# Patient Record
Sex: Male | Born: 1980 | Race: White | Hispanic: No | Marital: Married | Smoking: Current every day smoker
Health system: Southern US, Community
[De-identification: ages and names within clinical notes are randomized; demographics above are authoritative.]

---

## 2012-08-14 ENCOUNTER — Emergency Department: Payer: Self-pay | Admitting: Emergency Medicine

## 2014-03-10 ENCOUNTER — Emergency Department: Payer: Self-pay | Admitting: Emergency Medicine

## 2014-12-03 ENCOUNTER — Emergency Department: Payer: Self-pay | Admitting: Emergency Medicine

## 2015-02-07 ENCOUNTER — Emergency Department (HOSPITAL_COMMUNITY)
Admission: EM | Admit: 2015-02-07 | Discharge: 2015-02-07 | Disposition: A | Discharge status: Home / Self Care | Payer: Medicaid Other | Attending: Emergency Medicine | Admitting: Emergency Medicine

## 2015-02-07 ENCOUNTER — Emergency Department (HOSPITAL_COMMUNITY): Payer: Medicaid Other

## 2015-02-07 ENCOUNTER — Encounter (HOSPITAL_COMMUNITY): Payer: Self-pay | Admitting: Adult Health

## 2015-02-07 DIAGNOSIS — Y280XXA Contact with sharp glass, undetermined intent, initial encounter: Secondary | ICD-10-CM | POA: Insufficient documentation

## 2015-02-07 DIAGNOSIS — S61214A Laceration without foreign body of right ring finger without damage to nail, initial encounter: Secondary | ICD-10-CM | POA: Diagnosis not present

## 2015-02-07 DIAGNOSIS — Y9389 Activity, other specified: Secondary | ICD-10-CM | POA: Insufficient documentation

## 2015-02-07 DIAGNOSIS — S6991XA Unspecified injury of right wrist, hand and finger(s), initial encounter: Secondary | ICD-10-CM | POA: Diagnosis present

## 2015-02-07 DIAGNOSIS — S61219A Laceration without foreign body of unspecified finger without damage to nail, initial encounter: Secondary | ICD-10-CM

## 2015-02-07 DIAGNOSIS — Y999 Unspecified external cause status: Secondary | ICD-10-CM | POA: Insufficient documentation

## 2015-02-07 DIAGNOSIS — Z23 Encounter for immunization: Secondary | ICD-10-CM | POA: Diagnosis not present

## 2015-02-07 DIAGNOSIS — Z720 Tobacco use: Secondary | ICD-10-CM | POA: Diagnosis not present

## 2015-02-07 DIAGNOSIS — S61212A Laceration without foreign body of right middle finger without damage to nail, initial encounter: Secondary | ICD-10-CM | POA: Insufficient documentation

## 2015-02-07 DIAGNOSIS — Y929 Unspecified place or not applicable: Secondary | ICD-10-CM | POA: Diagnosis not present

## 2015-02-07 MED ORDER — LIDOCAINE HCL (PF) 1 % IJ SOLN
5.0000 mL | Freq: Once | INTRAMUSCULAR | Status: AC
  Administered 2015-02-07: 5 mL via INTRADERMAL
  Filled 2015-02-07: qty 5

## 2015-02-07 MED ORDER — HYDROCODONE-ACETAMINOPHEN 5-325 MG PO TABS: 1.0000 | ORAL_TABLET | ORAL | Status: AC | PRN

## 2015-02-07 MED ORDER — CEPHALEXIN 500 MG PO CAPS: 500.0000 mg | ORAL_CAPSULE | Freq: Four times a day (QID) | ORAL | Status: AC

## 2015-02-07 MED ORDER — TETANUS-DIPHTH-ACELL PERTUSSIS 5-2.5-18.5 LF-MCG/0.5 IM SUSP
0.5000 mL | Freq: Once | INTRAMUSCULAR | Status: AC
  Administered 2015-02-07: 0.5 mL via INTRAMUSCULAR
  Filled 2015-02-07: qty 0.5

## 2015-02-07 MED ORDER — OXYCODONE-ACETAMINOPHEN 5-325 MG PO TABS
1.0000 | ORAL_TABLET | Freq: Once | ORAL | Status: AC
  Administered 2015-02-07: 1 via ORAL
  Filled 2015-02-07: qty 1

## 2015-02-07 NOTE — ED Notes (Signed)
Pt requested crackers and pt was given graham crackers and coke. OK per RN [place]

## 2015-02-07 NOTE — ED Notes (Addendum)
Some skin noted as missing from right ring finger appears to be scraped away. Large laceration to middle finger noted with some deformity of surface skin noted.  Sensation intact, pt able to move finger against RN's finger.

## 2015-02-07 NOTE — ED Notes (Signed)
PRESENTS WITH RIGHT MIDDLE AND RING FINGER LACERATION FROM A FISH TANK THAT BROKE THIS AFTERNOON. BLEEDING CONTROLLED. TETANUS SHOT ABOUT 6 YEARS AGO.

## 2015-02-07 NOTE — Discharge Instructions (Signed)
Please follow directions provided. Be sure to return to the emergency department in 2 days for a wound recheck. Keep your wound clean and dry and change the dressing daily. You may take ibuprofen for pain. You may take Vicodin for pain not relieved by the ibuprofen. Please take the antibiotic as directed until it is all gone. Don't hesitate to return for any new, worsening, or concerning symptoms.   SEEK IMMEDIATE MEDICAL CARE IF:  Your wound becomes red, swollen, hot, or tender.  You have increasing pain in the wound.  You have a red streak that extends from the wound.  There is pus coming from the wound.  You have a fever.  You have shaking chills.  There is a bad smell coming from the wound.  You have persistent bleeding from the wound.

## 2015-02-07 NOTE — ED Provider Notes (Signed)
CSN: [id number]     Arrival date & time 02/07/15  0158 History   First MD Initiated Contact with Patient 02/07/15 0226     Chief Complaint  Patient presents with  . Laceration   (Consider location/radiation/quality/duration/timing/severity/associated sxs/prior Treatment) HPI  Jordan Clarke is a 34 year old male presenting with laceration to his right hand. He states he was helping a friend move a fish tank and at one point the fish tank pressed up against a wall causing the glass to break and causing him to cut the dorsal aspect of the third and fourth fingers on his right hand. He rates his pain currently 10 out of 10. He denies any numbness, tingling or weakness in his right hand.  History reviewed. No pertinent past medical history. History reviewed. No pertinent past surgical history. History reviewed. No pertinent family history. History  Substance Use Topics  . Smoking status: Current Every Day Smoker    Types: Cigarettes  . Smokeless tobacco: Not on file  . Alcohol Use: No    Review of Systems  Musculoskeletal: Positive for myalgias. Negative for joint swelling.  Skin: Positive for wound.  Neurological: Negative for weakness and numbness.      Allergies  Review of patient's allergies indicates no known allergies.  Home Medications   Prior to Admission medications   Not on File   BP 120/86 mmHg  Pulse 85  Temp(Src) 98 F (36.7 C) (Oral)  Resp 16  SpO2 97% Physical Exam  Constitutional: He is oriented to person, place, and time. He appears well-developed and well-nourished. No distress.  HENT:  Head: Normocephalic and atraumatic.  Eyes: Conjunctivae are normal. Right eye exhibits no discharge. Left eye exhibits no discharge. No scleral icterus.  Cardiovascular: Intact distal pulses.   Pulmonary/Chest: Effort normal.  Musculoskeletal: He exhibits tenderness.       Right hand: He exhibits tenderness and laceration. Normal sensation noted. Normal strength  noted.       Hands: 3.5 cm laceration to dorsal aspect of 3rd finger, superficial laceration to 4th MCP and superficial, abrasions and skin tears noted to 3rd and 4th fingers  Neurological: He is alert and oriented to person, place, and time. Coordination normal.  Skin: He is not diaphoretic.  Nursing note and vitals reviewed.   ED Course  Procedures (including critical care time) NERVE BLOCK Performed by: Harle Battiestysinger, Arwin Bisceglia Consent: Verbal consent obtained. Required items: required blood products, implants, devices, and special equipment available Time out: Immediately prior to procedure a "time out" was called to verify the correct patient, procedure, equipment, support staff and site/side marked as required.  Indication: laceration Nerve block body site: 3rd right finger  Preparation: Patient was prepped and draped in the usual sterile fashion. Needle gauge: 27 G Location technique: anatomical landmarks  Local anesthetic: lidocaine without epi  Anesthetic total: 5 ml  Outcome: pain improved Patient tolerance: Patient tolerated the procedure well with no immediate complications.  LACERATION REPAIR Performed by: Harle Battiestysinger, Lanae Federer Authorized by: Harle Battiestysinger, Aarilyn Dye Consent: Verbal consent obtained. Risks and benefits: risks, benefits and alternatives were discussed Consent given by: patient Patient identity confirmed: provided demographic data Prepped and Draped in normal sterile fashion Wound explored  Laceration Location: 3rd right finger  Laceration Length: 3.5 cm  No Foreign Bodies seen or palpated  Anesthesia: digital block  Local anesthetic: lidocaine 1% without epinephrine  Anesthetic total: 5 ml  Irrigation method: syringe Amount of cleaning: standard  Skin closure: 5-0 prolene  Number of sutures: 2  Technique:  simple interrupted  Patient tolerance: Patient tolerated the procedure well with no immediate complications.   Labs Review Labs  Reviewed - No data to display  Imaging Review Dg Hand Complete Right  02/07/2015   CLINICAL DATA:  Finger laceration from broken fish tank  EXAM: RIGHT HAND - COMPLETE 3+ VIEW  COMPARISON:  None.  FINDINGS: There is no evidence of fracture or dislocation. There is no evidence of arthropathy or other focal bone abnormality. Soft tissues are unremarkable.  IMPRESSION: Negative.   Electronically Signed   By: Ellery Plunkaniel R Mitchell M.D.   On: 02/07/2015 04:31     EKG Interpretation None      MDM   Final diagnoses:  Laceration of finger with delay in treatment, initial encounter   34 yo with laceration to right hand occurring 10-12 hours prior to repair. Copious pressure irrigation performed. X-ray is negative for foreign body. Discussed case with Dr. Marian Sorrowlga.  Tdap booster given. Loose suture repaired of largest wound and cleaning and wound dressed to all other wounds. Pt will return in 2 days for wound recheck. Prescription for keflex provided. Pt is well-appearing, in no acute distress and vital signs reviewed and not concerning. He appears safe to be discharged.  Discharge include follow-up in 2 days in the ED.  Return precautions provided. He is aware of plan and in agreement.   Filed Vitals:   02/07/15 0209 02/07/15 0543  BP: 120/86 124/69  Pulse: 85 76  Temp: 98 F (36.7 C)   TempSrc: Oral   Resp: 16 18  SpO2: 97% 99%    Meds given in ED:  Medications  oxyCODONE-acetaminophen (PERCOCET/ROXICET) 5-325 MG per tablet 1 tablet (1 tablet Oral Given 02/07/15 0343)  lidocaine (PF) (XYLOCAINE) 1 % injection 5 mL (5 mLs Intradermal Given 02/07/15 0343)  Tdap (BOOSTRIX) injection 0.5 mL (0.5 mLs Intramuscular Given 02/07/15 0347)    Discharge Medication List as of 02/07/2015  5:42 AM    START taking these medications   Details  cephALEXin (KEFLEX) 500 MG capsule Take 1 capsule (500 mg total) by mouth 4 (four) times daily., Starting 02/07/2015, Until Discontinued, Print    HYDROcodone-acetaminophen  (NORCO/VICODIN) 5-325 MG per tablet Take 1 tablet by mouth every 4 (four) hours as needed., Starting 02/07/2015, Until Discontinued, Print           Harle BattiestElizabeth Krystalle Pilkington, NP 02/07/15 1816  Marisa Severinlga Otter, MD 02/09/15 1801

## 2015-02-07 NOTE — ED Notes (Signed)
Transported to xray 

## 2015-09-07 ENCOUNTER — Ambulatory Visit: Payer: Medicaid Other

## 2015-09-07 ENCOUNTER — Encounter: Payer: Self-pay | Admitting: Emergency Medicine

## 2015-09-07 ENCOUNTER — Ambulatory Visit
Admission: EM | Admit: 2015-09-07 | Discharge: 2015-09-07 | Disposition: A | Discharge status: Home / Self Care | Payer: Medicaid Other | Attending: Family Medicine | Admitting: Family Medicine

## 2015-09-07 DIAGNOSIS — R05 Cough: Secondary | ICD-10-CM | POA: Diagnosis not present

## 2015-09-07 DIAGNOSIS — R509 Fever, unspecified: Secondary | ICD-10-CM | POA: Diagnosis not present

## 2015-09-07 DIAGNOSIS — R0981 Nasal congestion: Secondary | ICD-10-CM | POA: Insufficient documentation

## 2015-09-07 DIAGNOSIS — B349 Viral infection, unspecified: Secondary | ICD-10-CM | POA: Diagnosis not present

## 2015-09-07 LAB — RAPID INFLUENZA A&B ANTIGENS: Influenza A (ARMC): NOT DETECTED

## 2015-09-07 LAB — RAPID INFLUENZA A&B ANTIGENS (ARMC ONLY): INFLUENZA B (ARMC): NOT DETECTED

## 2015-09-07 MED ORDER — LORATADINE-PSEUDOEPHEDRINE ER 5-120 MG PO TB12: 1.0000 | ORAL_TABLET | Freq: Two times a day (BID) | ORAL | Status: AC

## 2015-09-07 MED ORDER — GUAIFENESIN-CODEINE 100-10 MG/5ML PO SOLN: 5.0000 mL | Freq: Three times a day (TID) | ORAL | Status: AC | PRN

## 2015-09-07 NOTE — Discharge Instructions (Signed)
Take medication as prescribed. Rest. Take over the counter tylenol or ibuprofen as needed.   Follow up closely with your primary care physician this week. Return to Urgent care as needed for new or worsening concerns.   Viral Infections A viral infection can be caused by different types of viruses.Most viral infections are not serious and resolve on their own. However, some infections may cause severe symptoms and may lead to further complications. SYMPTOMS Viruses can frequently cause:  Minor sore throat.  Aches and pains.  Headaches.  Runny nose.  Different types of rashes.  Watery eyes.  Tiredness.  Cough.  Loss of appetite.  Gastrointestinal infections, resulting in nausea, vomiting, and diarrhea. These symptoms do not respond to antibiotics because the infection is not caused by bacteria. However, you might catch a bacterial infection following the viral infection. This is sometimes called a "superinfection." Symptoms of such a bacterial infection may include:  Worsening sore throat with pus and difficulty swallowing.  Swollen neck glands.  Chills and a high or persistent fever.  Severe headache.  Tenderness over the sinuses.  Persistent overall ill feeling (malaise), muscle aches, and tiredness (fatigue).  Persistent cough.  Yellow, green, or brown mucus production with coughing. HOME CARE INSTRUCTIONS   Only take over-the-counter or prescription medicines for pain, discomfort, diarrhea, or fever as directed by your caregiver.  Drink enough water and fluids to keep your urine clear or pale yellow. Sports drinks can provide valuable electrolytes, sugars, and hydration.  Get plenty of rest and maintain proper nutrition. Soups and broths with crackers or rice are fine. SEEK IMMEDIATE MEDICAL CARE IF:   You have severe headaches, shortness of breath, chest pain, neck pain, or an unusual rash.  You have uncontrolled vomiting, diarrhea, or you are unable to  keep down fluids.  You or your child has an oral temperature above 102 F (38.9 C), not controlled by medicine.  Your baby is older than 3 months with a rectal temperature of 102 F (38.9 C) or higher.  Your baby is 603 months old or younger with a rectal temperature of 100.4 F (38 C) or higher. MAKE SURE YOU:   Understand these instructions.  Will watch your condition.  Will get help right away if you are not doing well or get worse.   This information is not intended to replace advice given to you by your health care provider. Make sure you discuss any questions you have with your health care provider.   Document Released: 06/29/2005 Document Revised: 12/12/2011 Document Reviewed: 02/25/2015 Elsevier Interactive Patient Education [employer]2016 [employer] Inc[employer]

## 2015-09-07 NOTE — ED Provider Notes (Signed)
[facility]  ____________________________________________  Time seen: Approximately 1:07 PM  I have reviewed the triage vital signs and the nursing notes.   HISTORY  Chief Complaint  Cough  HPI Lynn ItoChristopher Kimura is a 34 y.o. male presents for complaint of  to 3-4 days of runny nose, cough, congestion and body aches. Patient reports cough frequently keeps him up at night. States occasionally some whitish phlegm with cough. Patient reports he has felt more tired than normal as coughs wakes him up at night. Patient reports continues to eat and drink well. States that he has felt warm with chills but denies known fever. Denies sick contacts. States he has some generalized body aches this time at 3 out of 10. Denies other pain.  Denies chest pain, shortness of breath, abdominal pain, neck pain, back pain, dysuria, rash, fall or trauma. Patient reports unrelieved with over-the-counter Mucinex. Spouse at bedside.  [facility]    History reviewed. No pertinent past medical history.  There are no active problems to display for this patient.   History reviewed. No pertinent past surgical history.  Current Outpatient Rx  Name  Route  Sig  Dispense  Refill  .           Marland Kitchen.             Allergies Review of patient's allergies indicates no known allergies.  History reviewed. No pertinent family history.  Social History Social History  Substance Use Topics  . Smoking status: Current Every Day Smoker -- 1.00 packs/day    Types: Cigarettes  . Smokeless tobacco: None  . Alcohol Use: No    Review of Systems Constitutional: Subjective fever.  Eyes: No visual changes. ENT: No sore throat.Positive runny nose, nasal congestion, intermittent cough.  Cardiovascular: Denies chest pain. Respiratory: Denies shortness of breath. Gastrointestinal: No abdominal pain.  No nausea, no vomiting.  No diarrhea.  No constipation. Genitourinary: Negative for  dysuria. Musculoskeletal: Negative for back pain. Skin: Negative for rash. Neurological: Negative for headaches, focal weakness or numbness.  10-point ROS otherwise negative.  ____________________________________________   PHYSICAL EXAM:  VITAL SIGNS: ED Triage Vitals  Enc Vitals Group     BP 09/07/15 1248 129/75 mmHg     Pulse Rate 09/07/15 1248 84     Resp 09/07/15 1248 17     Temp 09/07/15 1248 96.9 F (36.1 C)     Temp Source 09/07/15 1248 Tympanic     SpO2 09/07/15 1248 100 %     Weight 09/07/15 1248 300 lb (136.079 kg)     Height 09/07/15 1248 6\' 2"  (1.88 m)     Head Cir --      Peak Flow --      Pain Score 09/07/15 1250 4     Pain Loc --      Pain Edu? --      Excl. in GC? --     Constitutional: Alert and oriented. Well appearing and in no acute distress. Eyes: Conjunctivae are normal. PERRL. EOMI. Head: Atraumatic.Nontender to palpation. No erythema. No swelling.  Ears: no erythema, normal TMs bilaterally.   Nose:Clear rhinorrhea. Nasal congestion. Nares patent.  Mouth/Throat: Mucous membranes are moist.  Oropharynx non-erythematous. No tonsillar swelling or exudate. No uvular shift or deviation.  Neck: No stridor.  No cervical spine tenderness to palpation. Hematological/Lymphatic/Immunilogical: No cervical lymphadenopathy. Cardiovascular: Normal rate, regular rhythm. Grossly normal heart sounds.  Good peripheral circulation. Respiratory: Normal respiratory effort.  No retractions. Lungs CTAB.No wheezes,  rales or rhonchi. Good air movement. Dry intermittent cough in room.  Gastrointestinal: Soft and nontender. No distention. Normal Bowel sounds.   No CVA tenderness. Musculoskeletal: No lower or upper extremity tenderness nor edema.  No joint effusions. Bilateral pedal pulses equal and easily palpated.  Neurologic:  Normal speech and language. No gross focal neurologic deficits are appreciated. No gait instability. Skin:  Skin is warm, dry and intact. No rash  noted. Psychiatric: Mood and affect are normal. Speech and behavior are normal.  ____________________________________________   LABS (all labs ordered are listed, but only abnormal results are displayed)  Labs Reviewed  RAPID INFLUENZA A&B ANTIGENS ([facility] ONLY)   RADIOLOGY   EXAM: CHEST 2 VIEW  COMPARISON: None.  FINDINGS: The heart size and mediastinal contours are within normal limits. Both lungs are clear. The visualized skeletal structures are unremarkable.  IMPRESSION: No active cardiopulmonary disease.   Electronically Signed By: Marlan Palau M.D. On: 09/07/2015 13:28 I, Renford Dills, personally viewed and evaluated these images (plain radiographs) as part of my medical decision making.     INITIAL IMPRESSION / ASSESSMENT AND PLAN / ED COURSE  Pertinent labs & imaging results that were available during my care of the patient were reviewed by me and considered in my medical decision making (see chart for details).  Well-appearing patient. No acute distress. Presents for to 3 days of runny nose, nasal congestion, subjective fevers as well as body aches. Reports sudden onset. Moist membranes. Lungs clear throughout. Abdomen soft and nontender. Reports continues to eat and drink well. Will evaluate for influenza and chest xray.   Chest xray no active cardiopulmonary disease. Influenza negative. Suspect viral upper respiratory infection, such as influenza. Will treat supportively with prn guaifenesin/codenie and claritin-d. Encourage rest, fluids, prn otc tylenol or ibuprofen. Encouraged close PCP follow up.   Discussed follow up with Primary care physician this week. Discussed follow up and return parameters including no resolution or any worsening concerns. Patient verbalized understanding and agreed to plan.   ____________________________________________   FINAL CLINICAL IMPRESSION(S) / ED DIAGNOSES  Final diagnoses:  Viral illness      Renford Dills, NP 09/07/15 1418

## 2015-09-07 NOTE — ED Notes (Signed)
Patient c/o cough, chest congestion, runny nose, and bodyaches for 2 days.

## 2015-09-08 ENCOUNTER — Encounter (HOSPITAL_COMMUNITY): Payer: Self-pay | Admitting: Adult Health

## 2016-04-18 IMAGING — CR DG CHEST 2V
2 series · 2 of 2 positions shown · non-contrast
Comparison: None.

CLINICAL DATA: Cough and fever and congestion

EXAM:
CHEST  2 VIEW

[chest pa]
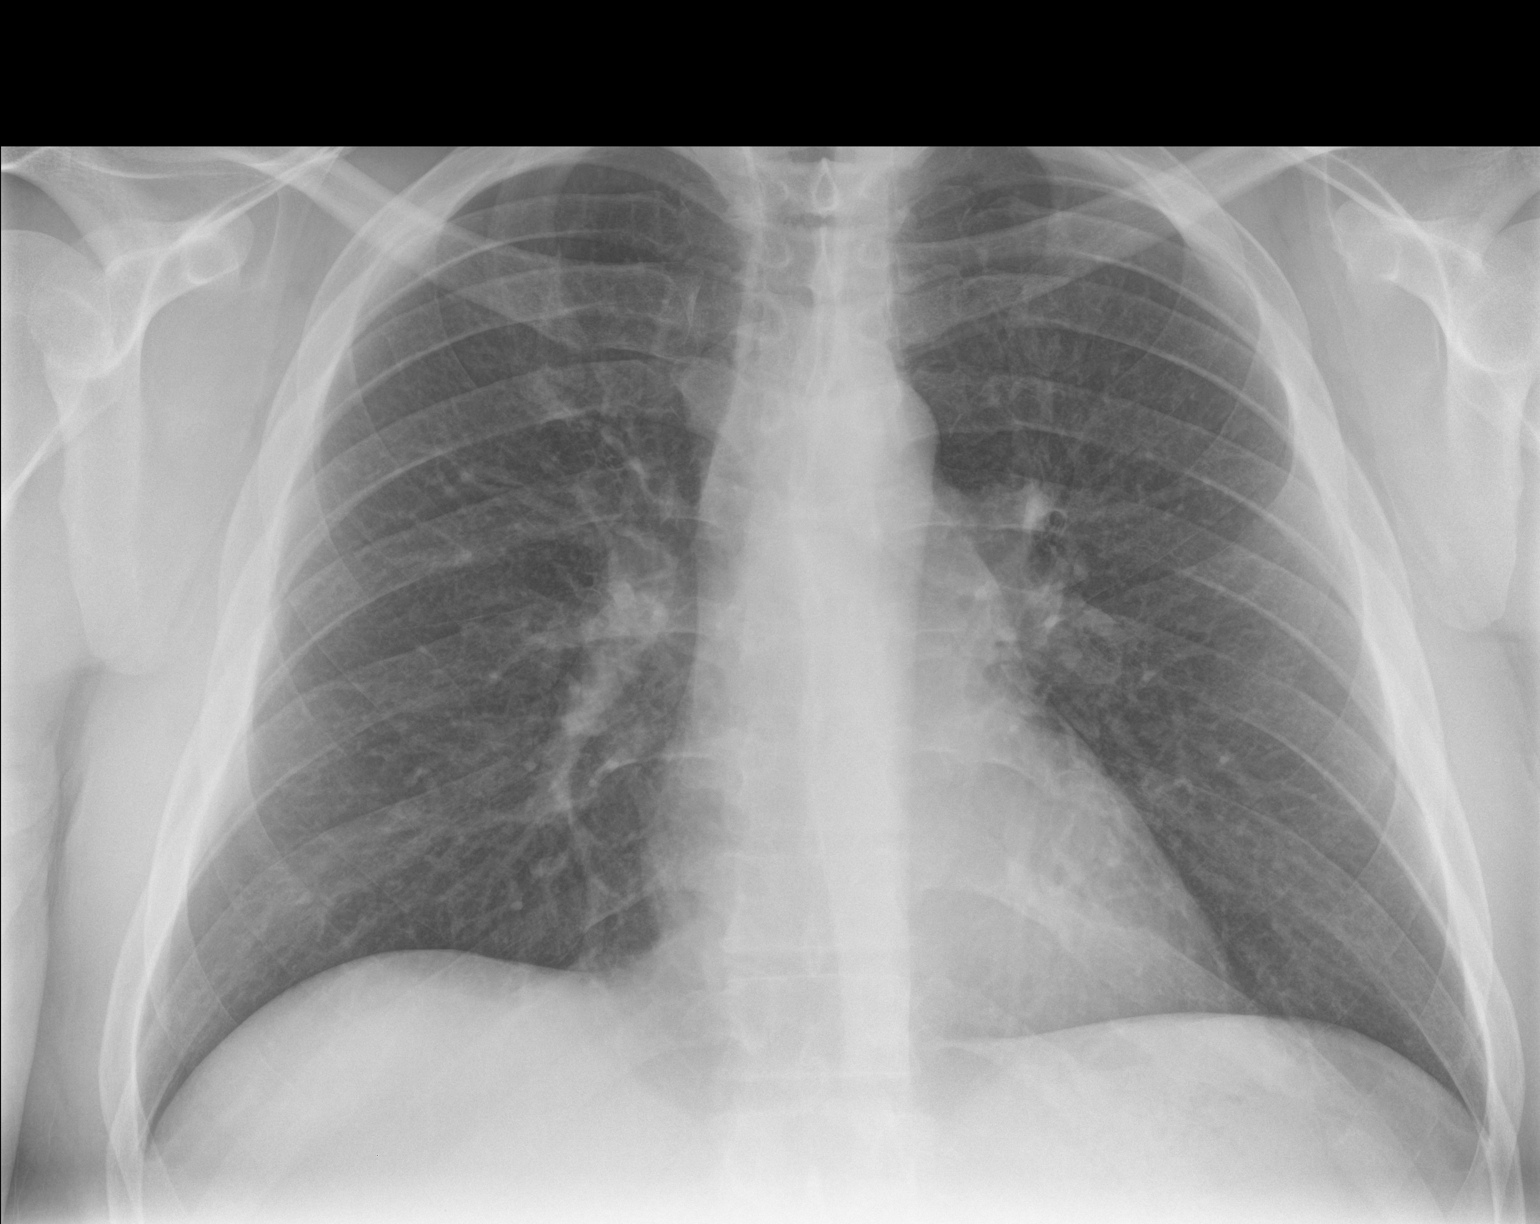

[chest lat]
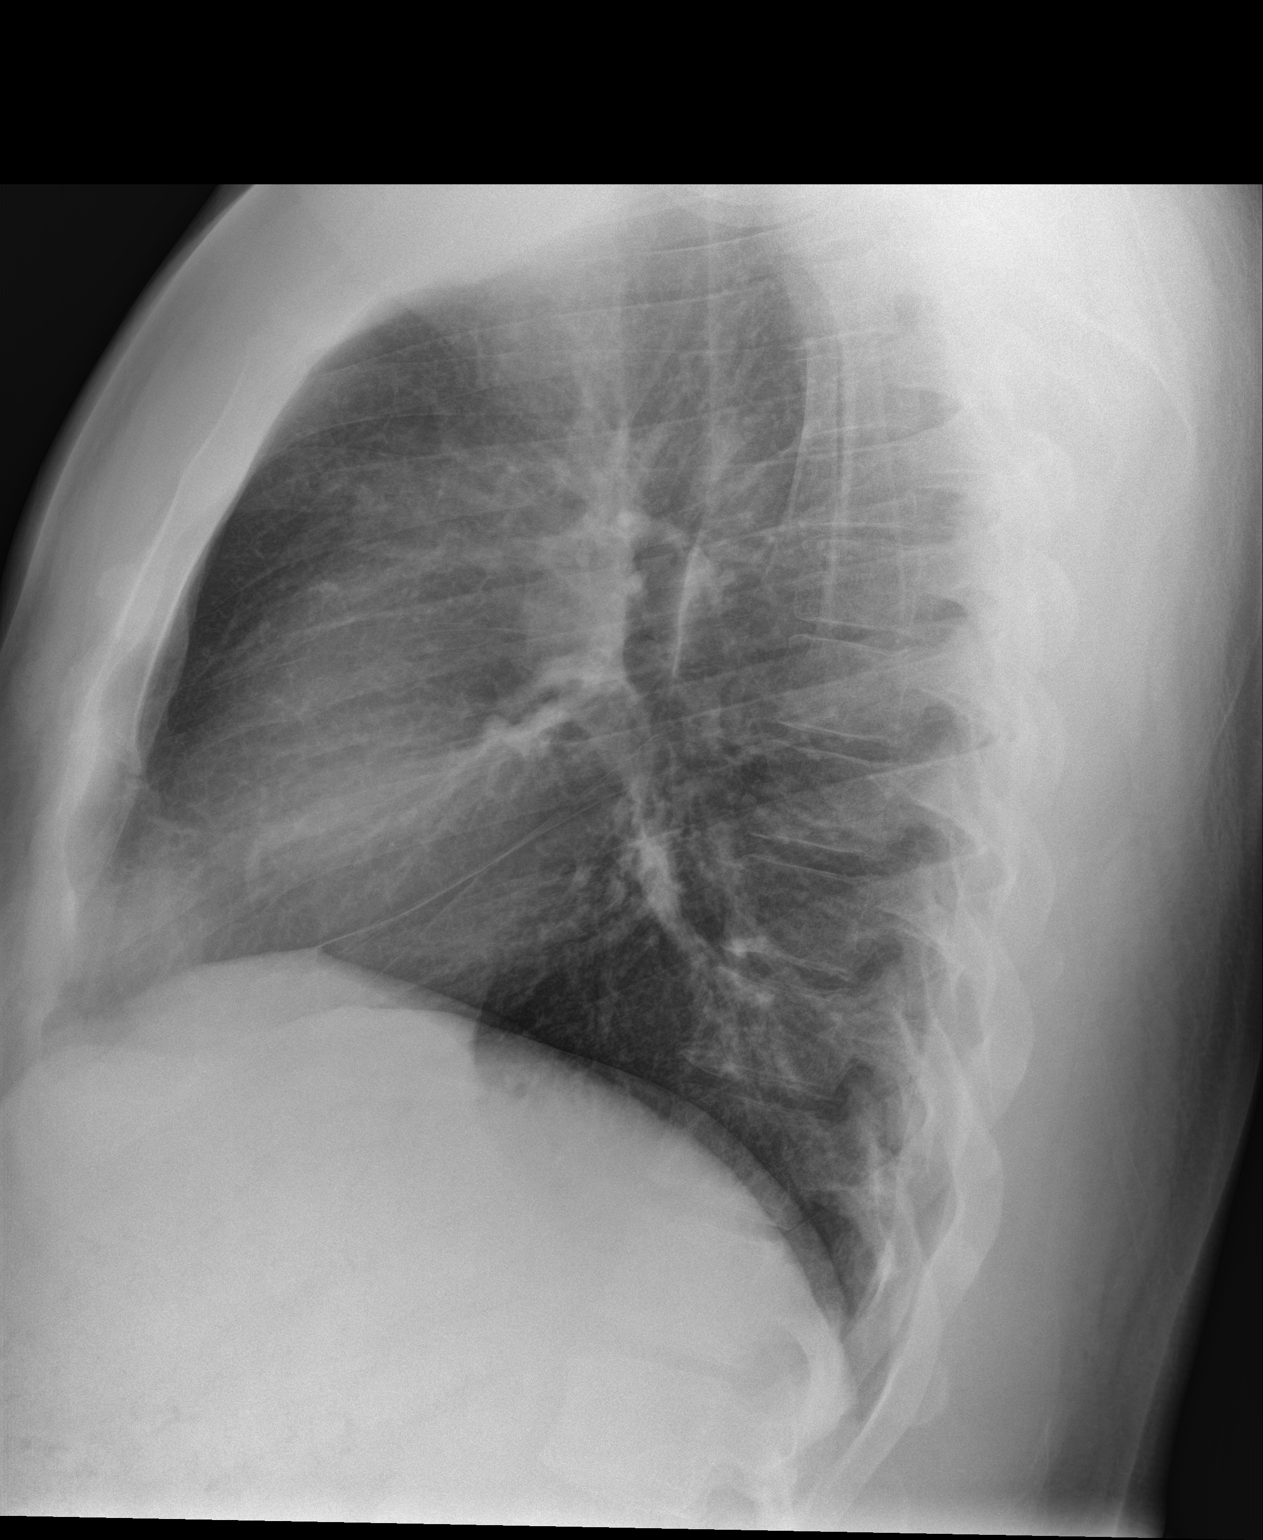

[2 of 2 positions shown; findings below may reference images not displayed]

FINDINGS: The heart size and mediastinal contours are within normal limits.
Both lungs are clear. The visualized skeletal structures are
unremarkable.
IMPRESSION: No active cardiopulmonary disease.

## 2022-03-03 DEATH — deceased
